# Patient Record
Sex: Male | Born: 1965 | Race: Black or African American | Hispanic: No | Marital: Single | Smoking: Current every day smoker
Health system: Southern US, Community
[De-identification: ages and names within clinical notes are randomized; demographics above are authoritative.]

## PROBLEM LIST (undated history)

## (undated) DIAGNOSIS — F32A Depression, unspecified: Secondary | ICD-10-CM

## (undated) DIAGNOSIS — F419 Anxiety disorder, unspecified: Secondary | ICD-10-CM

## (undated) DIAGNOSIS — F329 Major depressive disorder, single episode, unspecified: Secondary | ICD-10-CM

## (undated) DIAGNOSIS — M5030 Other cervical disc degeneration, unspecified cervical region: Secondary | ICD-10-CM

## (undated) HISTORY — DX: Anxiety disorder, unspecified: F41.9

## (undated) HISTORY — DX: Depression, unspecified: F32.A

## (undated) HISTORY — PX: APPENDECTOMY: SHX54

## (undated) HISTORY — DX: Other cervical disc degeneration, unspecified cervical region: M50.30

## (undated) HISTORY — PX: TONSILLECTOMY: SUR1361

---
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
## 1999-06-19 ENCOUNTER — Emergency Department (HOSPITAL_COMMUNITY): Admission: EM | Admit: 1999-06-19 | Discharge: 1999-06-19 | Payer: Self-pay | Admitting: Emergency Medicine

## 1999-06-20 ENCOUNTER — Observation Stay (HOSPITAL_COMMUNITY): Admission: EM | Admit: 1999-06-20 | Discharge: 1999-06-21 | Payer: Self-pay | Admitting: Internal Medicine

## 2007-04-26 ENCOUNTER — Emergency Department (HOSPITAL_COMMUNITY): Admission: EM | Admit: 2007-04-26 | Discharge: 2007-04-26 | Payer: Self-pay | Admitting: Family Medicine

## 2007-07-27 ENCOUNTER — Emergency Department (HOSPITAL_COMMUNITY): Admission: EM | Admit: 2007-07-27 | Discharge: 2007-07-27 | Payer: Self-pay | Admitting: Emergency Medicine

## 2007-09-03 ENCOUNTER — Emergency Department (HOSPITAL_COMMUNITY): Admission: EM | Admit: 2007-09-03 | Discharge: 2007-09-03 | Payer: Self-pay | Admitting: Family Medicine

## 2008-04-02 IMAGING — CR DG CHEST 2V
2 series · 2 of 2 positions shown · non-contrast
Comparison: None.

CLINICAL DATA: Chest tightness/short of breath. 
 CHEST - 2 VIEW - 09/03/07:

[w chest pa]
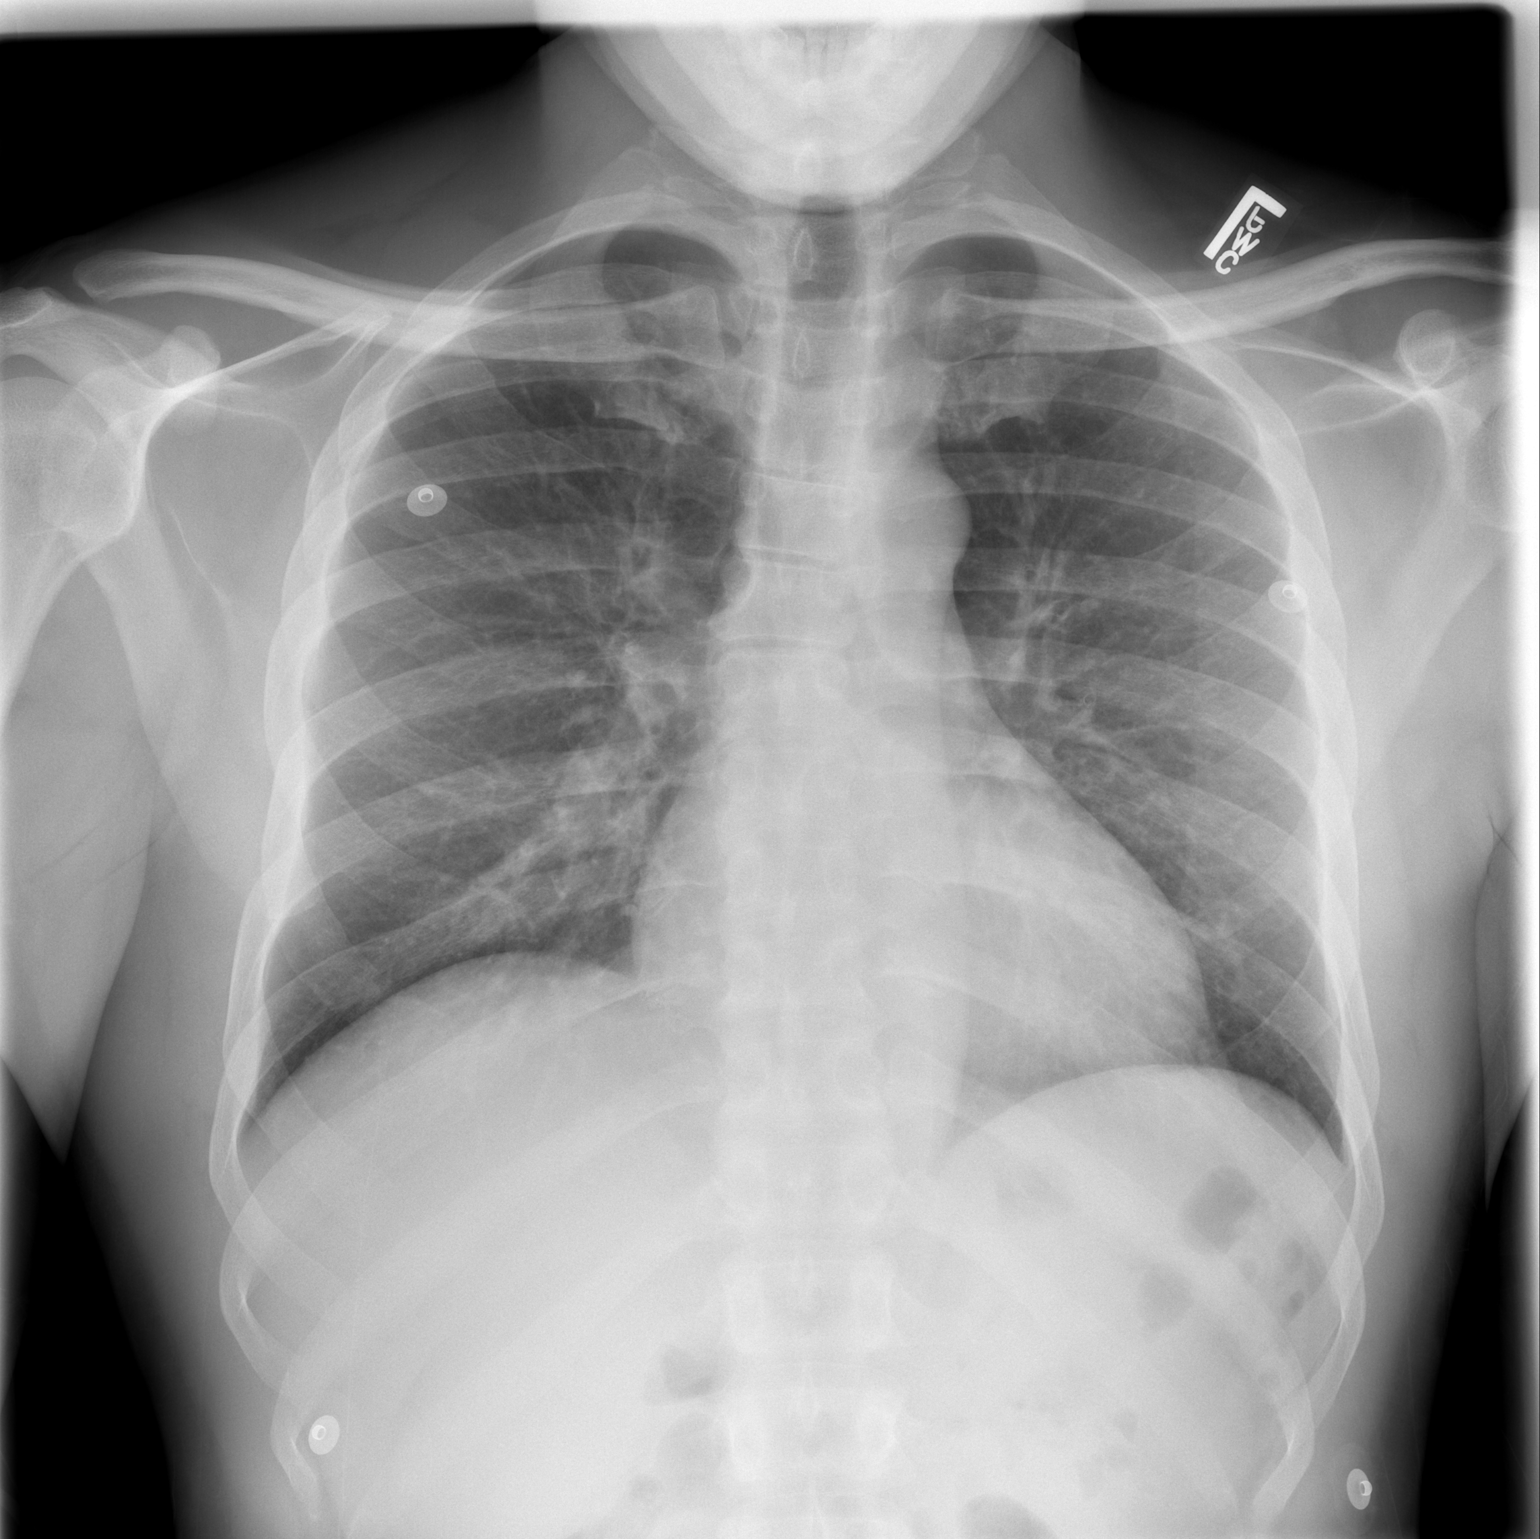

[w chest lat]
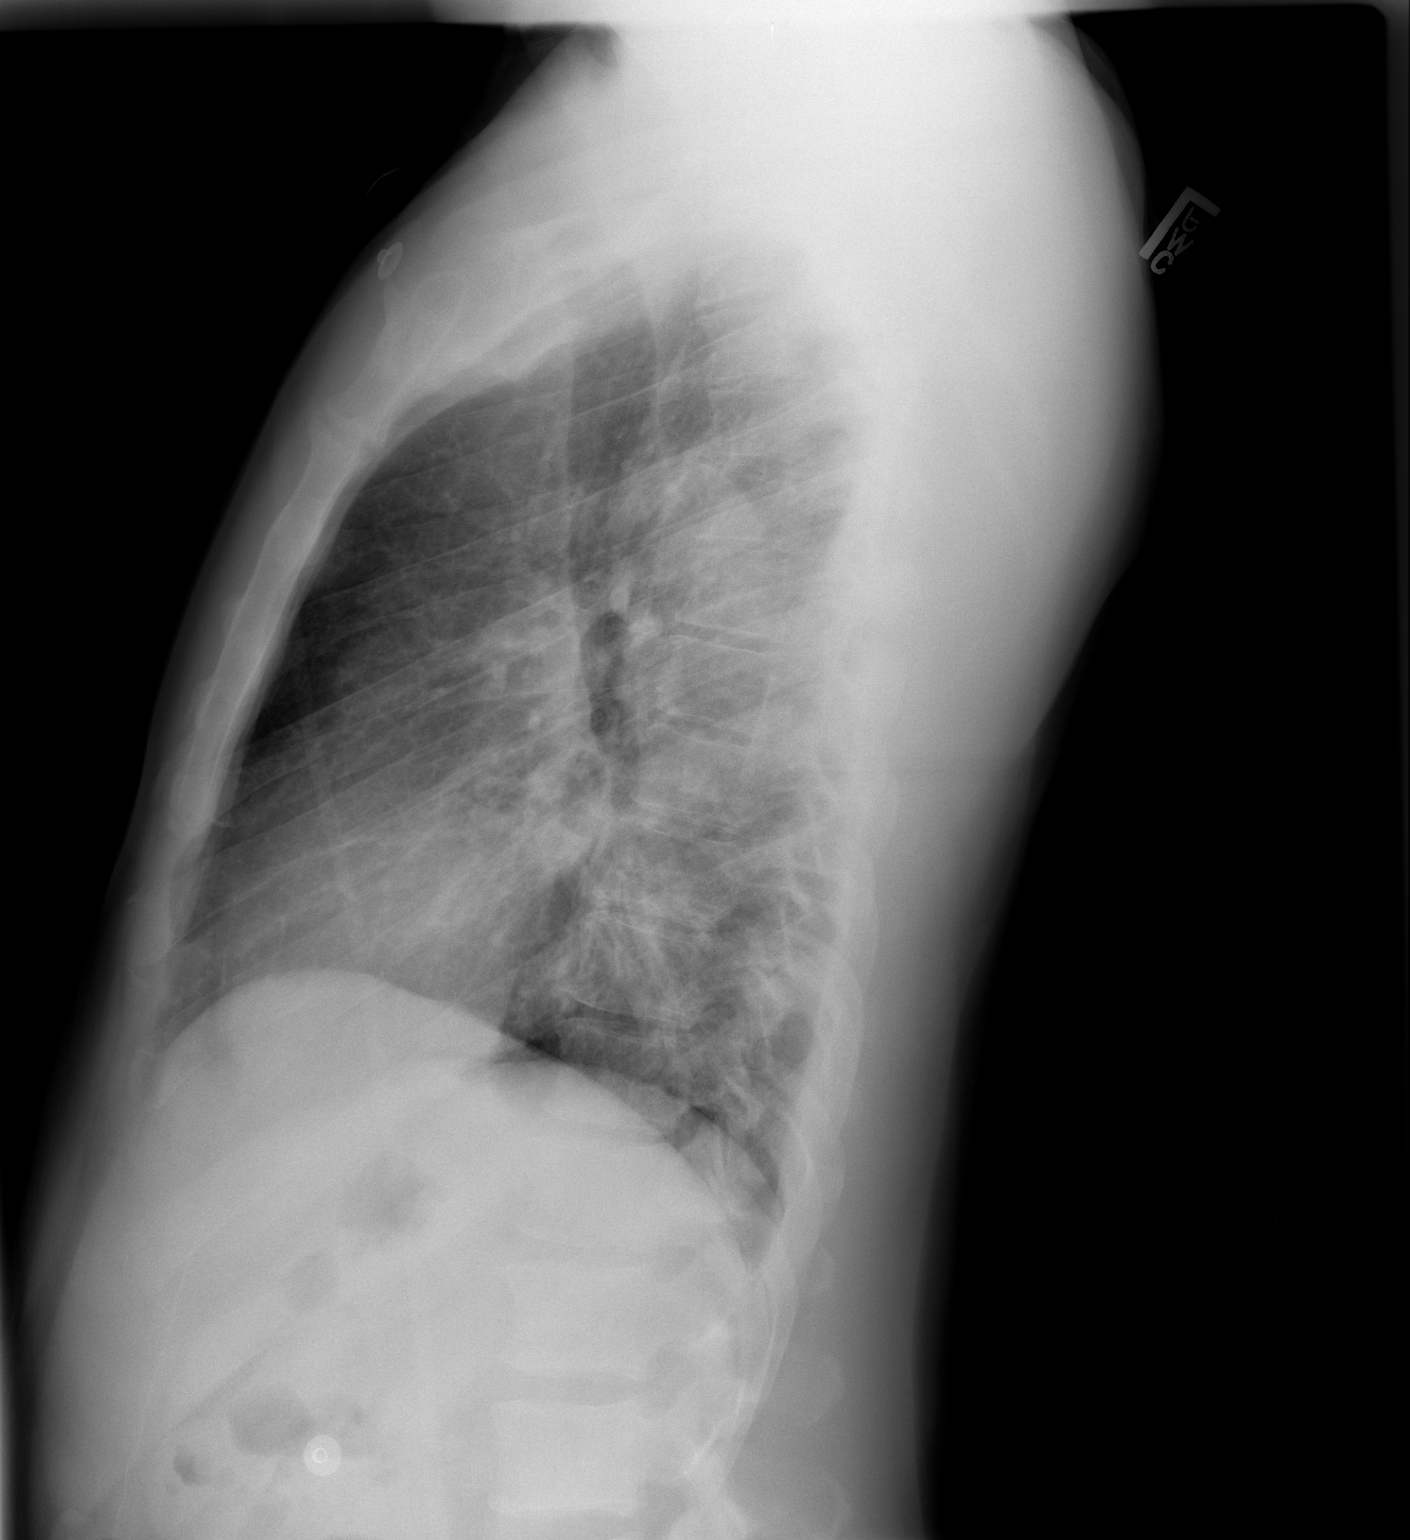

[2 of 2 positions shown; findings below may reference images not displayed]

FINDINGS: The heart and mediastinal contours are normal.  There is peribronchial thickening with generally prominent markings consistent with bronchitis.  No definite pneumonia or heart failure.  No pleural fluid.  Osseous structures are intact.
IMPRESSION: Peribronchial thickening ? no active disease.

## 2010-02-03 ENCOUNTER — Emergency Department (HOSPITAL_COMMUNITY): Admission: EM | Admit: 2010-02-03 | Discharge: 2010-02-03 | Payer: Self-pay | Admitting: Family Medicine

## 2010-06-03 ENCOUNTER — Emergency Department (HOSPITAL_COMMUNITY)
Admission: EM | Admit: 2010-06-03 | Discharge: 2010-06-03 | Payer: Self-pay | Source: Home / Self Care | Admitting: Family Medicine

## 2011-03-17 LAB — COMPREHENSIVE METABOLIC PANEL
ALT: 78 — ABNORMAL HIGH
AST: 46 — ABNORMAL HIGH
Albumin: 4.6
Alkaline Phosphatase: 76
Calcium: 9.8
GFR calc Af Amer: >60
Glucose, Bld: 117 — ABNORMAL HIGH
Potassium: 4.4
Sodium: 141
Total Protein: 8.7 — ABNORMAL HIGH

## 2011-03-17 LAB — LIPASE, BLOOD: Lipase: 23

## 2011-03-17 LAB — RAPID URINE DRUG SCREEN, HOSP PERFORMED
Amphetamines: NOT DETECTED
Benzodiazepines: NOT DETECTED
Cocaine: POSITIVE — AB

## 2011-03-17 LAB — ETHANOL: Alcohol, Ethyl (B): <5

## 2011-03-21 LAB — I-STAT 8, (EC8 V) (CONVERTED LAB)
Acid-base deficit: 2
Chloride: 109
Glucose, Bld: 96
Hemoglobin: 15.6
Potassium: 3.6
Sodium: 140
TCO2: 24

## 2011-03-21 LAB — POCT CARDIAC MARKERS
Myoglobin, poc: 39.2
Operator id: 151321

## 2011-03-21 LAB — POCT I-STAT CREATININE: Operator id: 151321

## 2011-12-28 ENCOUNTER — Encounter (HOSPITAL_COMMUNITY): Payer: Self-pay | Admitting: Emergency Medicine

## 2011-12-28 ENCOUNTER — Emergency Department (HOSPITAL_COMMUNITY)
Admission: EM | Admit: 2011-12-28 | Discharge: 2011-12-28 | Disposition: A | Discharge status: Home / Self Care | Payer: Medicare Other | Attending: Emergency Medicine | Admitting: Emergency Medicine

## 2011-12-28 DIAGNOSIS — L089 Local infection of the skin and subcutaneous tissue, unspecified: Secondary | ICD-10-CM

## 2011-12-28 DIAGNOSIS — F17200 Nicotine dependence, unspecified, uncomplicated: Secondary | ICD-10-CM | POA: Insufficient documentation

## 2011-12-28 DIAGNOSIS — L723 Sebaceous cyst: Secondary | ICD-10-CM | POA: Insufficient documentation

## 2011-12-28 MED ORDER — SULFAMETHOXAZOLE-TRIMETHOPRIM 800-160 MG PO TABS: 1.0000 | ORAL_TABLET | Freq: Two times a day (BID) | ORAL | Status: AC

## 2011-12-28 MED ORDER — CEPHALEXIN 500 MG PO CAPS: 500.0000 mg | ORAL_CAPSULE | Freq: Four times a day (QID) | ORAL | Status: AC

## 2011-12-28 MED ORDER — HYDROCODONE-ACETAMINOPHEN 5-325 MG PO TABS: 1.0000 | ORAL_TABLET | ORAL | Status: AC | PRN

## 2011-12-28 NOTE — ED Provider Notes (Signed)
History     CSN: [id number]  Arrival date & time 12/28/11  1324   First MD Initiated Contact with Patient 12/28/11 [phone]      Chief Complaint  Patient presents with  . Abscess    (Consider location/radiation/quality/duration/timing/severity/associated sxs/prior treatment) The history is provided by the patient.  46 y/o M presents to ED with c/c abscess anterior to left ear. Has had a chronic "lump" to the area. He reports that yesterday a friend attempted to pull a hair from the area and since that time he has had severe pain. No redness to the area. Denies pain inside the ear, ear drainage, or change in hearing. Denies fever. Denies drainage from the site. Has no history of abscess. Palpation of the area makes his pain worse, nothing makes it better. No prior treatment attempted.  History reviewed. No pertinent past medical history.  Past Surgical History  Procedure Date  . Appendectomy     No family history on file.  History  Substance Use Topics  . Smoking status: Current Everyday Smoker  . Smokeless tobacco: Not on file  . Alcohol Use: Yes      Review of Systems  Constitutional: Negative for fever and chills.  HENT: Negative for hearing loss, ear pain, neck pain and ear discharge.   Eyes: Negative for pain and visual disturbance.  Skin:       See HPI    Allergies  Coconut flavor  Home Medications   Current Outpatient Rx  Name Route Sig Dispense Refill  . ESCITALOPRAM OXALATE 5 MG PO TABS Oral Take 5 mg by mouth daily.    Marland Kitchen OVER THE COUNTER MEDICATION Oral Take 1 tablet by mouth daily. Nerve vitamins    . OVER THE COUNTER MEDICATION Oral Take 1 tablet by mouth daily. Energy vitamin    . TRAZODONE HCL 50 MG PO TABS Oral Take 50 mg by mouth at bedtime.      BP 145/115  Pulse 88  Temp 97.6 F (36.4 C) (Oral)  Resp 16  SpO2 97%  Physical Exam  Nursing note reviewed. Constitutional: He appears well-developed and well-nourished. No distress.       VS  reviewed and are sig for HTN  HENT:  Head: Normocephalic and atraumatic.    Right Ear: External ear normal.  Left Ear: External ear normal.  Mouth/Throat: Oropharynx is clear and moist.       Bilateral TM with scarring. Canals normal. Hearing intact to spoken voice.  Eyes: EOM are normal. Pupils are equal, round, and reactive to light.  Neck: Neck supple.  Cardiovascular: Normal rate.   Pulmonary/Chest: No respiratory distress.  Lymphadenopathy:    He has no cervical adenopathy.  Neurological: He is alert.  Skin: Skin is warm and dry.       See HENT exam.    ED Course  Procedures (including critical care time)  Labs Reviewed - No data to display No results found.   1. Infected cyst of skin       MDM  Cyst anterior to left ear. Tenderness to palpation suggests infection. Needle drainage attempted per pt request, without success. No overlying erythema, induration, or excess warmth to suggest abscess. I do not feel it appropriate to make facial incision at this time. He will be placed on antibiotics and is instructed to have 48 hour PCP/urgent care recheck with ED return precautions discussed.        [address] [place], [place] 12/28/11 [phone]

## 2011-12-28 NOTE — ED Notes (Addendum)
Pt c/o pain on left upper lateral side of face d/t an abscess, pt reports he has had it for about a year and tried to pop it and now it has started to hurt.

## 2011-12-28 NOTE — ED Notes (Signed)
Pt. C/o of abscess in front of left ear.

## 2011-12-29 NOTE — ED Provider Notes (Signed)
Medical screening examination/treatment/procedure(s) were performed by non-physician practitioner and as supervising physician I was immediately available for consultation/collaboration.   Shawn Carattini, MD 12/29/11 1320 

## 2019-02-20 ENCOUNTER — Encounter: Payer: Self-pay | Admitting: Neurology

## 2019-02-28 ENCOUNTER — Institutional Professional Consult (permissible substitution): Payer: Self-pay | Admitting: Neurology

## 2019-03-12 ENCOUNTER — Institutional Professional Consult (permissible substitution): Payer: Self-pay | Admitting: Neurology

## 2019-03-12 ENCOUNTER — Telehealth: Payer: Self-pay | Admitting: Neurology

## 2019-03-12 NOTE — Telephone Encounter (Signed)
Patient was no show to apt today 

## 2019-03-13 ENCOUNTER — Encounter: Payer: Self-pay | Admitting: Neurology
